# Patient Record
Sex: Female | Born: 2000
Health system: Southern US, Community
[De-identification: ages and names within clinical notes are randomized; demographics above are authoritative.]

## PROBLEM LIST (undated history)

## (undated) DIAGNOSIS — N946 Dysmenorrhea, unspecified: Secondary | ICD-10-CM

## (undated) HISTORY — DX: Dysmenorrhea, unspecified: N94.6

---
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
## 2018-05-15 DIAGNOSIS — L700 Acne vulgaris: Secondary | ICD-10-CM | POA: Diagnosis not present

## 2018-07-09 NOTE — Progress Notes (Signed)
18 y.o. G0P0000 Single White or Caucasian Not Hispanic or Latino female here for annual exam.  She has a mirena IUD, placed in 6/19. She stopped having cycles for a while, she has had a monthly cycle for the last 4 months.  Cycles are okay, better than prior to contraception.  Sexually active, same partner x 6 months, just broke up. Not using condoms.   Period Cycle (Days): 28 Period Duration (Days): 4-5 days  Period Pattern: Regular Menstrual Flow: Light, Moderate Menstrual Control: Tampon Menstrual Control Change Freq (Hours): changes tampon every 5-6 hours Dysmenorrhea: (!) Moderate Dysmenorrhea Symptoms: Cramping  Patient's last menstrual period was 06/30/2018 (exact date).          Sexually active: Yes.    The current method of family planning is IUD.    Exercising: Yes.    lacrosse in college Smoker:  no  Health Maintenance: Pap:  N/A TDaP:  01/31/2010 Gardasil: completed 1, had a bad reaction, didn't do the other shots.    reports that she has never smoked. She has never used smokeless tobacco. She reports that she does not drink alcohol or use drugs. 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 She plays lacrosse.  Just finished her second year. She is studying Education officer, museum and the Environment. She would like to work in one of the [employer].  Past Medical History:  Diagnosis Date  . Dysmenorrhea     Past Surgical History:  Procedure Laterality Date  . osteochondroma removal  09/07/2017   left femur    Current Outpatient Medications  Medication Sig Dispense Refill  . IBUPROFEN PO Take by mouth as needed.    Marland Kitchen levonorgestrel (MIRENA) 20 MCG/24HR IUD 1 each by Intrauterine route once.     No current facility-administered medications for this visit.     Family History  Problem Relation Age of Onset  . Hypertension Father   . Hypertension Maternal Grandmother   . Thyroid disease Maternal Grandmother   . Diabetes Paternal Grandmother   . Hypertension Paternal  Grandmother   . Thyroid disease Paternal Grandmother   . Hypertension Paternal Grandfather   . Heart disease Paternal Grandfather        triple bypass  . Thyroid disease Mother   . Diabetes Paternal Aunt     Review of Systems  Constitutional: Negative.   HENT: Negative.   Eyes: Negative.   Respiratory: Negative.   Cardiovascular: Negative.   Gastrointestinal: Negative.   Endocrine: Negative.   Genitourinary: Negative.   Musculoskeletal: Negative.   Skin: Negative.   Allergic/Immunologic: Negative.   Neurological: Negative.   Hematological: Negative.   Psychiatric/Behavioral: Negative.     Exam:   BP 118/78 (BP Location: Left Arm, Patient Position: Sitting, Cuff Size: Normal)   Pulse 88   Temp 98.7 F (37.1 C) (Skin)   Ht 5' 0.5" (1.537 m)   Wt 121 lb 3.2 oz (55 kg)   LMP 06/30/2018 (Exact Date)   BMI 23.28 kg/m   Weight change: @WEIGHTCHANGE @ Height:   Height: 5' 0.5" (153.7 cm)  Ht Readings from Last 3 Encounters:  07/11/18 5' 0.5" (1.537 m)  06/21/17 5' 0.5" (1.537 m) (7 %, Z= -1.48)*  03/15/16 5' 0.25" (1.53 m) (6 %, Z= -1.55)*   * Growth percentiles are based on CDC (Girls, 2-20 Years) data.    General appearance: alert, cooperative and appears stated age Head: Normocephalic, without obvious abnormality, atraumatic Neck: no adenopathy, supple, symmetrical, trachea midline and thyroid normal to  inspection and palpation Lungs: clear to auscultation bilaterally Cardiovascular: regular rate and rhythm Breasts: normal appearance, no masses or tenderness Abdomen: soft, non-tender; non distended,  no masses,  no organomegaly Extremities: extremities normal, atraumatic, no cyanosis or edema Skin: Skin color, texture, turgor normal. No rashes or lesions Lymph nodes: Cervical, supraclavicular, and axillary nodes normal. No abnormal inguinal nodes palpated Neurologic: Grossly normal   Pelvic: External genitalia:  no lesions              Urethra:  normal appearing  urethra with no masses, tenderness or lesions              Bartholins and Skenes: normal                 Vagina: normal appearing vagina with normal color and discharge, no lesions              Cervix: no lesions and iud string 3 cm               Bimanual Exam:  Uterus:  normal size, contour, position, consistency, mobility, non-tender              Adnexa: no mass, fullness, tenderness               Rectovaginal: Confirms               Anus:  normal sphincter tone, no lesions  Chaperone was present for exam.  A:  Well Woman with normal exam  H/O elevated lipids  IUD check  P:   Pap next year  Return for fasting labs  STD testing  Discussed breast self exam  Discussed calcium and vit D intake

## 2018-07-11 ENCOUNTER — Other Ambulatory Visit: Payer: Self-pay

## 2018-07-11 ENCOUNTER — Encounter: Payer: Self-pay | Admitting: Obstetrics and Gynecology

## 2018-07-11 ENCOUNTER — Ambulatory Visit (INDEPENDENT_AMBULATORY_CARE_PROVIDER_SITE_OTHER): Payer: 59 | Admitting: Obstetrics and Gynecology

## 2018-07-11 VITALS — BP 118/78 | HR 88 | Temp 98.7°F | Ht 60.5 in | Wt 121.2 lb

## 2018-07-11 DIAGNOSIS — Z30431 Encounter for routine checking of intrauterine contraceptive device: Secondary | ICD-10-CM | POA: Diagnosis not present

## 2018-07-11 DIAGNOSIS — Z113 Encounter for screening for infections with a predominantly sexual mode of transmission: Secondary | ICD-10-CM | POA: Diagnosis not present

## 2018-07-11 DIAGNOSIS — E785 Hyperlipidemia, unspecified: Secondary | ICD-10-CM | POA: Diagnosis not present

## 2018-07-11 DIAGNOSIS — Z01419 Encounter for gynecological examination (general) (routine) without abnormal findings: Secondary | ICD-10-CM

## 2018-07-11 DIAGNOSIS — Z0000 Encounter for general adult medical examination without abnormal findings: Secondary | ICD-10-CM

## 2018-07-11 MED ORDER — IBUPROFEN 800 MG PO TABS: 800.0000 mg | ORAL_TABLET | Freq: Three times a day (TID) | ORAL | 2 refills | Status: AC | PRN

## 2018-07-11 NOTE — Patient Instructions (Signed)
EXERCISE AND DIET:  We recommended that you start or continue a regular exercise program for good health. Regular exercise means any activity that makes your heart beat faster and makes you sweat.  We recommend exercising at least 30 minutes per day at least 3 days a week, preferably 4 or 5.  We also recommend a diet low in fat and sugar.  Inactivity, poor dietary choices and obesity can cause diabetes, heart attack, stroke, and kidney damage, among others.    ALCOHOL AND SMOKING:  Women should limit their alcohol intake to no more than 7 drinks/beers/glasses of wine (combined, not each!) per week. Moderation of alcohol intake to this level decreases your risk of breast cancer and liver damage. And of course, no recreational drugs are part of a healthy lifestyle.  And absolutely no smoking or even second hand smoke. Most people know smoking can cause heart and lung diseases, but did you know it also contributes to weakening of your bones? Aging of your skin?  Yellowing of your teeth and nails?  CALCIUM AND VITAMIN D:  Adequate intake of calcium and Vitamin D are recommended.  The recommendations for exact amounts of these supplements seem to change often, but generally speaking 1,000 mg of calcium (between diet and supplement) and 800 units of Vitamin D per day seems prudent. Certain women may benefit from higher intake of Vitamin D.  If you are among these women, your doctor will have told you during your visit.    PAP SMEARS:  Pap smears, to check for cervical cancer or precancers,  have traditionally been done yearly, although recent scientific advances have shown that most women can have pap smears less often.  However, every woman still should have a physical exam from her gynecologist every year. It will include a breast check, inspection of the vulva and vagina to check for abnormal growths or skin changes, a visual exam of the cervix, and then an exam to evaluate the size and shape of the uterus and  ovaries.  And after 20 years of age, a rectal exam is indicated to check for rectal cancers. We will also provide age appropriate advice regarding health maintenance, like when you should have certain vaccines, screening for sexually transmitted diseases, bone density testing, colonoscopy, mammograms, etc.   MAMMOGRAMS:  All women over 40 years old should have a yearly mammogram. Many facilities now offer a "3D" mammogram, which may cost around $50 extra out of pocket. If possible,  we recommend you accept the option to have the 3D mammogram performed.  It both reduces the number of women who will be called back for extra views which then turn out to be normal, and it is better than the routine mammogram at detecting truly abnormal areas.    COLON CANCER SCREENING: Now recommend starting at age 45. At this time colonoscopy is not covered for routine screening until 50. There are take home tests that can be done between 45-49.   COLONOSCOPY:  Colonoscopy to screen for colon cancer is recommended for all women at age 50.  We know, you hate the idea of the prep.  We agree, BUT, having colon cancer and not knowing it is worse!!  Colon cancer so often starts as a polyp that can be seen and removed at colonscopy, which can quite literally save your life!  And if your first colonoscopy is normal and you have no family history of colon cancer, most women don't have to have it again for   10 years.  Once every ten years, you can do something that may end up saving your life, right?  We will be happy to help you get it scheduled when you are ready.  Be sure to check your insurance coverage so you understand how much it will cost.  It may be covered as a preventative service at no cost, but you should check your particular policy.      Breast Self-Awareness Breast self-awareness means being familiar with how your breasts look and feel. It involves checking your breasts regularly and reporting any changes to your  health care provider. Practicing breast self-awareness is important. A change in your breasts can be a sign of a serious medical problem. Being familiar with how your breasts look and feel allows you to find any problems early, when treatment is more likely to be successful. All women should practice breast self-awareness, including women who have had breast implants. How to do a breast self-exam One way to learn what is normal for your breasts and whether your breasts are changing is to do a breast self-exam. To do a breast self-exam: Look for Changes  1. Remove all the clothing above your waist. 2. Stand in front of a mirror in a room with good lighting. 3. Put your hands on your hips. 4. Push your hands firmly downward. 5. Compare your breasts in the mirror. Look for differences between them (asymmetry), such as: ? Differences in shape. ? Differences in size. ? Puckers, dips, and bumps in one breast and not the other. 6. Look at each breast for changes in your skin, such as: ? Redness. ? Scaly areas. 7. Look for changes in your nipples, such as: ? Discharge. ? Bleeding. ? Dimpling. ? Redness. ? A change in position. Feel for Changes Carefully feel your breasts for lumps and changes. It is best to do this while lying on your back on the floor and again while sitting or standing in the shower or tub with soapy water on your skin. Feel each breast in the following way:  Place the arm on the side of the breast you are examining above your head.  Feel your breast with the other hand.  Start in the nipple area and make  inch (2 cm) overlapping circles to feel your breast. Use the pads of your three middle fingers to do this. Apply light pressure, then medium pressure, then firm pressure. The light pressure will allow you to feel the tissue closest to the skin. The medium pressure will allow you to feel the tissue that is a little deeper. The firm pressure will allow you to feel the tissue  close to the ribs.  Continue the overlapping circles, moving downward over the breast until you feel your ribs below your breast.  Move one finger-width toward the center of the body. Continue to use the  inch (2 cm) overlapping circles to feel your breast as you move slowly up toward your collarbone.  Continue the up and down exam using all three pressures until you reach your armpit.  Write Down What You Find  Write down what is normal for each breast and any changes that you find. Keep a written record with breast changes or normal findings for each breast. By writing this information down, you do not need to depend only on memory for size, tenderness, or location. Write down where you are in your menstrual cycle, if you are still menstruating. If you are having trouble noticing differences   in your breasts, do not get discouraged. With time you will become more familiar with the variations in your breasts and more comfortable with the exam. How often should I examine my breasts? Examine your breasts every month. If you are breastfeeding, the best time to examine your breasts is after a feeding or after using a breast pump. If you menstruate, the best time to examine your breasts is 5-7 days after your period is over. During your period, your breasts are lumpier, and it may be more difficult to notice changes. When should I see my health care provider? See your health care provider if you notice:  A change in shape or size of your breasts or nipples.  A change in the skin of your breast or nipples, such as a reddened or scaly area.  Unusual discharge from your nipples.  A lump or thick area that was not there before.  Pain in your breasts.  Anything that concerns you.  

## 2018-07-12 ENCOUNTER — Other Ambulatory Visit: Payer: Self-pay

## 2018-07-13 ENCOUNTER — Other Ambulatory Visit: Payer: 59

## 2018-07-13 ENCOUNTER — Telehealth: Payer: Self-pay | Admitting: Obstetrics and Gynecology

## 2018-07-13 NOTE — Telephone Encounter (Signed)
Patient cancelled lab appointment because she is sick. Will call back to reschedule.

## 2018-07-18 LAB — CHLAMYDIA/GONOCOCCUS/TRICHOMONAS, NAA
Chlamydia by NAA: NEGATIVE
Gonococcus by NAA: NEGATIVE
Trich vag by NAA: NEGATIVE

## 2018-07-31 ENCOUNTER — Other Ambulatory Visit (INDEPENDENT_AMBULATORY_CARE_PROVIDER_SITE_OTHER): Payer: 59

## 2018-07-31 DIAGNOSIS — Z113 Encounter for screening for infections with a predominantly sexual mode of transmission: Secondary | ICD-10-CM

## 2018-07-31 DIAGNOSIS — Z0000 Encounter for general adult medical examination without abnormal findings: Secondary | ICD-10-CM

## 2018-08-02 LAB — CBC
Hematocrit: 39.6 % (ref 34.0–46.6)
Hemoglobin: 13.1 g/dL (ref 11.1–15.9)
MCH: 29.1 pg (ref 26.6–33.0)
MCHC: 33.1 g/dL (ref 31.5–35.7)
MCV: 88 fL (ref 79–97)
Platelets: 272 10*3/uL (ref 150–450)
RBC: 4.5 x10E6/uL (ref 3.77–5.28)
RDW: 12.5 % (ref 11.7–15.4)
WBC: 6.9 10*3/uL (ref 3.4–10.8)

## 2018-08-02 LAB — LIPID PANEL
Chol/HDL Ratio: 2.5 ratio (ref 0.0–4.4)
Cholesterol, Total: 125 mg/dL (ref 100–199)
HDL: 50 mg/dL (ref >39)
LDL Calculated: 66 mg/dL (ref 0–99)
Triglycerides: 43 mg/dL (ref 0–149)
VLDL Cholesterol Cal: 9 mg/dL (ref 5–40)

## 2018-08-02 LAB — COMPREHENSIVE METABOLIC PANEL
ALT: 9 IU/L (ref 0–32)
AST: 15 IU/L (ref 0–40)
Albumin/Globulin Ratio: 2.6 — ABNORMAL HIGH (ref 1.2–2.2)
Albumin: 5.1 g/dL — ABNORMAL HIGH (ref 3.9–5.0)
Alkaline Phosphatase: 79 IU/L (ref 39–117)
BUN/Creatinine Ratio: 14 (ref 9–23)
BUN: 13 mg/dL (ref 6–20)
Bilirubin Total: 0.8 mg/dL (ref 0.0–1.2)
CO2: 21 mmol/L (ref 20–29)
Calcium: 10.2 mg/dL (ref 8.7–10.2)
Chloride: 102 mmol/L (ref 96–106)
Creatinine, Ser: 0.94 mg/dL (ref 0.57–1.00)
GFR calc Af Amer: 101 mL/min/{1.73_m2} (ref >59)
GFR calc non Af Amer: 88 mL/min/{1.73_m2} (ref >59)
Globulin, Total: 2 g/dL (ref 1.5–4.5)
Glucose: 83 mg/dL (ref 65–99)
Potassium: 4.6 mmol/L (ref 3.5–5.2)
Sodium: 139 mmol/L (ref 134–144)
Total Protein: 7.1 g/dL (ref 6.0–8.5)

## 2018-08-02 LAB — HEP, RPR, HIV PANEL
HIV Screen 4th Generation wRfx: NONREACTIVE
Hepatitis B Surface Ag: NEGATIVE
RPR Ser Ql: NONREACTIVE

## 2018-08-02 LAB — HEPATITIS C ANTIBODY: Hep C Virus Ab: <0.1 s/co ratio (ref 0.0–0.9)

## 2019-07-15 ENCOUNTER — Ambulatory Visit: Payer: 59 | Admitting: Obstetrics and Gynecology

## 2019-07-18 NOTE — Progress Notes (Signed)
19 y.o. G0P0000 Single White or Caucasian Not Hispanic or Latino female here for annual exam.  She has a mirena IUD, placed in 6/19. Sexually active, no current partner. Uses condoms.  Period Cycle (Days): 28 Period Duration (Days): 4-5 Period Pattern: Regular Menstrual Flow: Light Menstrual Control: Tampon Menstrual Control Change Freq (Hours): 6 Dysmenorrhea: (!) Mild Dysmenorrhea Symptoms: Cramping  Patient's last menstrual period was 06/22/2019.          Sexually active: Yes.    The current method of family planning is IUD.    Exercising: Yes.    walking Smoker:  no  Health Maintenance: Pap:  NA History of abnormal Pap:  NA TDaP:  2012 Gardasil: Completed on had bad reaction, didn't do the other shots.    reports that she has never smoked. She has never used smokeless tobacco. She reports that she does not drink alcohol and does not use drugs. She will be a senior in college in the fall, transferred to [employer]. Likes it. She is studying Education officer, museum and the Environment.She would like to work in one of the [employer].  Past Medical History:  Diagnosis Date  . Dysmenorrhea     Past Surgical History:  Procedure Laterality Date  . osteochondroma removal  09/07/2017   left femur    Current Outpatient Medications  Medication Sig Dispense Refill  . ibuprofen (ADVIL) 800 MG tablet Take 1 tablet (800 mg total) by mouth every 8 (eight) hours as needed. 30 tablet 2  . levonorgestrel (MIRENA) 20 MCG/24HR IUD 1 each by Intrauterine route once.    . tretinoin (RETIN-A) 0.025 % cream SMARTSIG:1 Topical Every Night     No current facility-administered medications for this visit.    Family History  Problem Relation Age of Onset  . Hypertension Father   . Hypertension Maternal Grandmother   . Thyroid disease Maternal Grandmother   . Diabetes Paternal Grandmother   . Hypertension Paternal Grandmother   . Thyroid disease Paternal Grandmother   . Hypertension Paternal  Grandfather   . Heart disease Paternal Grandfather        triple bypass  . Thyroid disease Mother   . Diabetes Paternal Aunt     Review of Systems  Constitutional: Negative.   HENT: Negative.   Eyes: Negative.   Respiratory: Negative.   Cardiovascular: Negative.   Gastrointestinal: Negative.   Endocrine: Negative.   Genitourinary: Negative.   Musculoskeletal: Negative.   Skin: Negative.   Allergic/Immunologic: Negative.   Neurological: Negative.   Hematological: Negative.   Psychiatric/Behavioral: Negative.     Exam:   BP 110/60 (BP Location: Right Arm, Patient Position: Sitting, Cuff Size: Normal)   Pulse 80   Temp (!) 97.2 F (36.2 C) (Temporal)   Resp 10   Ht 5' 0.5" (1.537 m)   Wt 121 lb (54.9 kg)   LMP 06/22/2019   BMI 23.24 kg/m   Weight change: @WEIGHTCHANGE @ Height:   Height: 5' 0.5" (153.7 cm)  Ht Readings from Last 3 Encounters:  07/22/19 5' 0.5" (1.537 m)  07/11/18 5' 0.5" (1.537 m)  06/21/17 5' 0.5" (1.537 m) (7 %, Z= -1.48)*   * Growth percentiles are based on CDC (Girls, 2-20 Years) data.    General appearance: alert, cooperative and appears stated age Head: Normocephalic, without obvious abnormality, atraumatic Neck: no adenopathy, supple, symmetrical, trachea midline and thyroid normal to inspection and palpation Lungs: clear to auscultation bilaterally Cardiovascular: regular rate and rhythm Breasts: normal appearance, no masses or tenderness Abdomen:  soft, non-tender; non distended,  no masses,  no organomegaly Extremities: extremities normal, atraumatic, no cyanosis or edema Skin: Skin color, texture, turgor normal. No rashes or lesions Lymph nodes: Cervical, supraclavicular, and axillary nodes normal. No abnormal inguinal nodes palpated Neurologic: Grossly normal   Pelvic: External genitalia:  no lesions              Urethra:  normal appearing urethra with no masses, tenderness or lesions              Bartholins and Skenes: normal                  Vagina: normal appearing vagina with normal color and discharge, no lesions              Cervix: no lesions, IUD string 3 cm               Bimanual Exam:  Uterus:  normal size, contour, position, consistency, mobility, non-tender              Adnexa: no mass, fullness, tenderness               Rectovaginal: Confirms               Anus:  normal sphincter tone, no lesions  Terence Lux chaperoned for the exam.  A:  Well Woman with normal exam  IUD check, mirena. Doing well    P:   Pap  STD testing  Screening labs  Discussed breast self exam  Discussed calcium and vit D intake  TDAP today  Condom use encouraged

## 2019-07-19 ENCOUNTER — Other Ambulatory Visit: Payer: Self-pay

## 2019-07-22 ENCOUNTER — Encounter: Payer: Self-pay | Admitting: Obstetrics and Gynecology

## 2019-07-22 ENCOUNTER — Other Ambulatory Visit: Payer: Self-pay

## 2019-07-22 ENCOUNTER — Other Ambulatory Visit (HOSPITAL_COMMUNITY)
Admission: RE | Admit: 2019-07-22 | Discharge: 2019-07-22 | Disposition: A | Discharge status: Home / Self Care | Payer: 59 | Source: Ambulatory Visit | Attending: Obstetrics and Gynecology | Admitting: Obstetrics and Gynecology

## 2019-07-22 ENCOUNTER — Ambulatory Visit (INDEPENDENT_AMBULATORY_CARE_PROVIDER_SITE_OTHER): Payer: 59 | Admitting: Obstetrics and Gynecology

## 2019-07-22 VITALS — BP 110/60 | HR 80 | Temp 97.2°F | Resp 10 | Ht 60.5 in | Wt 121.0 lb

## 2019-07-22 DIAGNOSIS — Z124 Encounter for screening for malignant neoplasm of cervix: Secondary | ICD-10-CM | POA: Insufficient documentation

## 2019-07-22 DIAGNOSIS — Z0000 Encounter for general adult medical examination without abnormal findings: Secondary | ICD-10-CM

## 2019-07-22 DIAGNOSIS — Z01419 Encounter for gynecological examination (general) (routine) without abnormal findings: Secondary | ICD-10-CM | POA: Diagnosis not present

## 2019-07-22 DIAGNOSIS — Z113 Encounter for screening for infections with a predominantly sexual mode of transmission: Secondary | ICD-10-CM | POA: Insufficient documentation

## 2019-07-22 DIAGNOSIS — Z30431 Encounter for routine checking of intrauterine contraceptive device: Secondary | ICD-10-CM | POA: Diagnosis not present

## 2019-07-22 DIAGNOSIS — Z23 Encounter for immunization: Secondary | ICD-10-CM | POA: Diagnosis not present

## 2019-07-22 NOTE — Patient Instructions (Signed)
EXERCISE AND DIET:  We recommended that you start or continue a regular exercise program for good health. Regular exercise means any activity that makes your heart beat faster and makes you sweat.  We recommend exercising at least 30 minutes per day at least 3 days a week, preferably 4 or 5.  We also recommend a diet low in fat and sugar.  Inactivity, poor dietary choices and obesity can cause diabetes, heart attack, stroke, and kidney damage, among others.    ALCOHOL AND SMOKING:  Women should limit their alcohol intake to no more than 7 drinks/beers/glasses of wine (combined, not each!) per week. Moderation of alcohol intake to this level decreases your risk of breast cancer and liver damage. And of course, no recreational drugs are part of a healthy lifestyle.  And absolutely no smoking or even second hand smoke. Most people know smoking can cause heart and lung diseases, but did you know it also contributes to weakening of your bones? Aging of your skin?  Yellowing of your teeth and nails?  CALCIUM AND VITAMIN D:  Adequate intake of calcium and Vitamin D are recommended.  The recommendations for exact amounts of these supplements seem to change often, but generally speaking 1,000 mg of calcium (between diet and supplement) and 800 units of Vitamin D per day seems prudent. Certain women may benefit from higher intake of Vitamin D.  If you are among these women, your doctor will have told you during your visit.    PAP SMEARS:  Pap smears, to check for cervical cancer or precancers,  have traditionally been done yearly, although recent scientific advances have shown that most women can have pap smears less often.  However, every woman still should have a physical exam from her gynecologist every year. It will include a breast check, inspection of the vulva and vagina to check for abnormal growths or skin changes, a visual exam of the cervix, and then an exam to evaluate the size and shape of the uterus and  ovaries.  And after 21 years of age, a rectal exam is indicated to check for rectal cancers. We will also provide age appropriate advice regarding health maintenance, like when you should have certain vaccines, screening for sexually transmitted diseases, bone density testing, colonoscopy, mammograms, etc.   MAMMOGRAMS:  All women over 40 years old should have a yearly mammogram. Many facilities now offer a "3D" mammogram, which may cost around $50 extra out of pocket. If possible,  we recommend you accept the option to have the 3D mammogram performed.  It both reduces the number of women who will be called back for extra views which then turn out to be normal, and it is better than the routine mammogram at detecting truly abnormal areas.    COLON CANCER SCREENING: Now recommend starting at age 45. At this time colonoscopy is not covered for routine screening until 50. There are take home tests that can be done between 45-49.   COLONOSCOPY:  Colonoscopy to screen for colon cancer is recommended for all women at age 50.  We know, you hate the idea of the prep.  We agree, BUT, having colon cancer and not knowing it is worse!!  Colon cancer so often starts as a polyp that can be seen and removed at colonscopy, which can quite literally save your life!  And if your first colonoscopy is normal and you have no family history of colon cancer, most women don't have to have it again for   10 years.  Once every ten years, you can do something that may end up saving your life, right?  We will be happy to help you get it scheduled when you are ready.  Be sure to check your insurance coverage so you understand how much it will cost.  It may be covered as a preventative service at no cost, but you should check your particular policy.      Breast Self-Awareness Breast self-awareness means being familiar with how your breasts look and feel. It involves checking your breasts regularly and reporting any changes to your  health care provider. Practicing breast self-awareness is important. A change in your breasts can be a sign of a serious medical problem. Being familiar with how your breasts look and feel allows you to find any problems early, when treatment is more likely to be successful. All women should practice breast self-awareness, including women who have had breast implants. How to do a breast self-exam One way to learn what is normal for your breasts and whether your breasts are changing is to do a breast self-exam. To do a breast self-exam: Look for Changes  1. Remove all the clothing above your waist. 2. Stand in front of a mirror in a room with good lighting. 3. Put your hands on your hips. 4. Push your hands firmly downward. 5. Compare your breasts in the mirror. Look for differences between them (asymmetry), such as: ? Differences in shape. ? Differences in size. ? Puckers, dips, and bumps in one breast and not the other. 6. Look at each breast for changes in your skin, such as: ? Redness. ? Scaly areas. 7. Look for changes in your nipples, such as: ? Discharge. ? Bleeding. ? Dimpling. ? Redness. ? A change in position. Feel for Changes Carefully feel your breasts for lumps and changes. It is best to do this while lying on your back on the floor and again while sitting or standing in the shower or tub with soapy water on your skin. Feel each breast in the following way:  Place the arm on the side of the breast you are examining above your head.  Feel your breast with the other hand.  Start in the nipple area and make  inch (2 cm) overlapping circles to feel your breast. Use the pads of your three middle fingers to do this. Apply light pressure, then medium pressure, then firm pressure. The light pressure will allow you to feel the tissue closest to the skin. The medium pressure will allow you to feel the tissue that is a little deeper. The firm pressure will allow you to feel the tissue  close to the ribs.  Continue the overlapping circles, moving downward over the breast until you feel your ribs below your breast.  Move one finger-width toward the center of the body. Continue to use the  inch (2 cm) overlapping circles to feel your breast as you move slowly up toward your collarbone.  Continue the up and down exam using all three pressures until you reach your armpit.  Write Down What You Find  Write down what is normal for each breast and any changes that you find. Keep a written record with breast changes or normal findings for each breast. By writing this information down, you do not need to depend only on memory for size, tenderness, or location. Write down where you are in your menstrual cycle, if you are still menstruating. If you are having trouble noticing differences   in your breasts, do not get discouraged. With time you will become more familiar with the variations in your breasts and more comfortable with the exam. How often should I examine my breasts? Examine your breasts every month. If you are breastfeeding, the best time to examine your breasts is after a feeding or after using a breast pump. If you menstruate, the best time to examine your breasts is 5-7 days after your period is over. During your period, your breasts are lumpier, and it may be more difficult to notice changes. When should I see my health care provider? See your health care provider if you notice:  A change in shape or size of your breasts or nipples.  A change in the skin of your breast or nipples, such as a reddened or scaly area.  Unusual discharge from your nipples.  A lump or thick area that was not there before.  Pain in your breasts.  Anything that concerns you.  

## 2019-07-23 LAB — CBC WITH DIFFERENTIAL/PLATELET
Basophils Absolute: 0.1 10*3/uL (ref 0.0–0.2)
Basos: 1 %
EOS (ABSOLUTE): 0.2 10*3/uL (ref 0.0–0.4)
Eos: 2 %
Hematocrit: 39 % (ref 34.0–46.6)
Hemoglobin: 13.2 g/dL (ref 11.1–15.9)
Immature Grans (Abs): 0 10*3/uL (ref 0.0–0.1)
Immature Granulocytes: 0 %
Lymphocytes Absolute: 2.2 10*3/uL (ref 0.7–3.1)
Lymphs: 33 %
MCH: 29.1 pg (ref 26.6–33.0)
MCHC: 33.8 g/dL (ref 31.5–35.7)
MCV: 86 fL (ref 79–97)
Monocytes Absolute: 0.5 10*3/uL (ref 0.1–0.9)
Monocytes: 7 %
Neutrophils Absolute: 3.7 10*3/uL (ref 1.4–7.0)
Neutrophils: 57 %
Platelets: 240 10*3/uL (ref 150–450)
RBC: 4.54 x10E6/uL (ref 3.77–5.28)
RDW: 12.7 % (ref 11.7–15.4)
WBC: 6.6 10*3/uL (ref 3.4–10.8)

## 2019-07-23 LAB — HEPATITIS C ANTIBODY: Hep C Virus Ab: <0.1 s/co ratio (ref 0.0–0.9)

## 2019-07-23 LAB — COMPREHENSIVE METABOLIC PANEL
ALT: 11 IU/L (ref 0–32)
AST: 18 IU/L (ref 0–40)
Albumin/Globulin Ratio: 2.4 — ABNORMAL HIGH (ref 1.2–2.2)
Albumin: 5 g/dL (ref 3.9–5.0)
Alkaline Phosphatase: 76 IU/L (ref 48–121)
BUN/Creatinine Ratio: 12 (ref 9–23)
BUN: 9 mg/dL (ref 6–20)
Bilirubin Total: 0.7 mg/dL (ref 0.0–1.2)
CO2: 25 mmol/L (ref 20–29)
Calcium: 9.9 mg/dL (ref 8.7–10.2)
Chloride: 100 mmol/L (ref 96–106)
Creatinine, Ser: 0.75 mg/dL (ref 0.57–1.00)
GFR calc Af Amer: 132 mL/min/{1.73_m2} (ref >59)
GFR calc non Af Amer: 114 mL/min/{1.73_m2} (ref >59)
Globulin, Total: 2.1 g/dL (ref 1.5–4.5)
Glucose: 84 mg/dL (ref 65–99)
Potassium: 4.3 mmol/L (ref 3.5–5.2)
Sodium: 139 mmol/L (ref 134–144)
Total Protein: 7.1 g/dL (ref 6.0–8.5)

## 2019-07-23 LAB — LIPID PANEL
Chol/HDL Ratio: 2.3 ratio (ref 0.0–4.4)
Cholesterol, Total: 144 mg/dL (ref 100–199)
HDL: 62 mg/dL (ref >39)
LDL Chol Calc (NIH): 70 mg/dL (ref 0–99)
Triglycerides: 56 mg/dL (ref 0–149)
VLDL Cholesterol Cal: 12 mg/dL (ref 5–40)

## 2019-07-23 LAB — HEP, RPR, HIV PANEL
HIV Screen 4th Generation wRfx: NONREACTIVE
Hepatitis B Surface Ag: NEGATIVE
RPR Ser Ql: NONREACTIVE

## 2019-07-23 LAB — CYTOLOGY - PAP
Chlamydia: NEGATIVE
Comment: NEGATIVE
Comment: NEGATIVE
Comment: NORMAL
Diagnosis: NEGATIVE
Neisseria Gonorrhea: NEGATIVE
Trichomonas: NEGATIVE

## 2020-07-29 ENCOUNTER — Ambulatory Visit: Payer: 59 | Admitting: Obstetrics and Gynecology
